# Patient Record
Sex: Male | Born: 1975 | Race: White | Hispanic: No | Marital: Married | Smoking: Never smoker
Health system: Southern US, Community
[De-identification: ages and names within clinical notes are randomized; demographics above are authoritative.]

## PROBLEM LIST (undated history)

## (undated) ENCOUNTER — Emergency Department (HOSPITAL_BASED_OUTPATIENT_CLINIC_OR_DEPARTMENT_OTHER): Payer: BLUE CROSS/BLUE SHIELD

## (undated) DIAGNOSIS — E7800 Pure hypercholesterolemia, unspecified: Secondary | ICD-10-CM

## (undated) HISTORY — PX: WISDOM TOOTH EXTRACTION: SHX21

---

## 2017-10-01 ENCOUNTER — Emergency Department: Payer: BLUE CROSS/BLUE SHIELD

## 2017-10-01 ENCOUNTER — Emergency Department
Admission: EM | Admit: 2017-10-01 | Discharge: 2017-10-01 | Disposition: A | Discharge status: Home / Self Care | Payer: BLUE CROSS/BLUE SHIELD | Attending: Emergency Medicine | Admitting: Emergency Medicine

## 2017-10-01 ENCOUNTER — Other Ambulatory Visit: Payer: Self-pay

## 2017-10-01 ENCOUNTER — Encounter: Payer: Self-pay | Admitting: Emergency Medicine

## 2017-10-01 DIAGNOSIS — R0789 Other chest pain: Secondary | ICD-10-CM

## 2017-10-01 DIAGNOSIS — R079 Chest pain, unspecified: Secondary | ICD-10-CM | POA: Diagnosis present

## 2017-10-01 DIAGNOSIS — R071 Chest pain on breathing: Secondary | ICD-10-CM | POA: Insufficient documentation

## 2017-10-01 HISTORY — DX: Pure hypercholesterolemia, unspecified: E78.00

## 2017-10-01 LAB — BASIC METABOLIC PANEL
Anion gap: 8 (ref 5–15)
BUN: 15 mg/dL (ref 6–20)
CO2: 27 mmol/L (ref 22–32)
CREATININE: 0.87 mg/dL (ref 0.61–1.24)
Calcium: 9.7 mg/dL (ref 8.9–10.3)
Chloride: 103 mmol/L (ref 101–111)
GFR calc Af Amer: >60 mL/min (ref >60)
Glucose, Bld: 92 mg/dL (ref 65–99)
POTASSIUM: 3.9 mmol/L (ref 3.5–5.1)
SODIUM: 138 mmol/L (ref 135–145)

## 2017-10-01 LAB — CBC
HEMATOCRIT: 43.4 % (ref 40.0–52.0)
Hemoglobin: 14.7 g/dL (ref 13.0–18.0)
MCH: 32.1 pg (ref 26.0–34.0)
MCHC: 33.9 g/dL (ref 32.0–36.0)
MCV: 94.7 fL (ref 80.0–100.0)
PLATELETS: 194 10*3/uL (ref 150–440)
RBC: 4.58 MIL/uL (ref 4.40–5.90)
RDW: 12.6 % (ref 11.5–14.5)
WBC: 7.9 10*3/uL (ref 3.8–10.6)

## 2017-10-01 LAB — TROPONIN I

## 2017-10-01 NOTE — Discharge Instructions (Signed)

## 2017-10-01 NOTE — ED Triage Notes (Signed)
Pt c/o substernal chest pain with no radiation. Pt states pain worse with deep inspiration and movement. Pt states pain started Friday night and has worsened since then. Pt is alert and oriented at this time. NAD noted. Able to speak in complete sentences without difficulty.

## 2017-10-01 NOTE — ED Provider Notes (Signed)
[facility] Emergency Department Provider Note  ____________________________________________   First MD Initiated Contact with Patient 10/01/17 1207     (approximate)  I have reviewed the triage vital signs and the nursing notes.   HISTORY  Chief Complaint Chest Pain  HPI Jose Moore is a 42 y.o. male presents for evaluation right-sided chest pain.  Patient reports for at least 2-3 days now is experiencing a achy pain across the right side of the chest.  Worse with inspiration.  No change with exertion.  No shortness of breath.  Does report a recent cough and cold after returning from a [place] vacation.  At present he reports the pain is mild to moderate worse with deep inspiration.  No productive cough.  No runny nose.  No hemoptysis.  Reports it feels very achy and some relief with ibuprofen at home.  Nonradiating.  No left-sided chest pain.  No numbness tingling or weakness.  Does not extend into his abdomen.  Denies abdominal pain  Past Medical History:  Diagnosis Date  . High cholesterol     There are no active problems to display for this patient.   Past Surgical History:  Procedure Laterality Date  . WISDOM TOOTH EXTRACTION      Prior to Admission medications   Not on File    Allergies Patient has no known allergies.  History reviewed. No pertinent family history.  Social History Social History   Tobacco Use  . Smoking status: Never Smoker  . Smokeless tobacco: Never Used  Substance Use Topics  . Alcohol use: Yes    Comment: Occ  . Drug use: No    Review of Systems Constitutional: No fever/chills though he does report about a week or 2 ago he had a cough and "cold" after returning from [place] Eyes: No visual changes. ENT: No sore throat. Cardiovascular: See HPI pain does not get worse with sitting forward or laying back.  Is somewhat improved by ibuprofen Respiratory: Denies shortness of  breath. Gastrointestinal: No abdominal pain.  No nausea, no vomiting.  No diarrhea.  No constipation. Genitourinary: Negative for dysuria. Musculoskeletal: Negative for back pain. Skin: Negative for rash. Neurological: Negative for headaches, focal weakness or numbness.  Denies personal history of heart disease.  Denies early onset of heart disease in his family.  He has not smoked.  He takes no estrogens.  No leg swelling.  No long trips or travel, though he did travel about an hour and a half by plane to [place] earlier in the month.  No leg swelling.  No recent trauma or surgery.  ____________________________________________   PHYSICAL EXAM:  VITAL SIGNS: ED Triage Vitals  Enc Vitals Group     BP 10/01/17 1045 117/79     Pulse Rate 10/01/17 1045 76     Resp 10/01/17 1045 18     Temp 10/01/17 1045 98.1 F (36.7 C)     Temp Source 10/01/17 1045 Oral     SpO2 10/01/17 1045 98 %     Weight 10/01/17 1042 180 lb (81.6 kg)     Height 10/01/17 1042 5\' 10"  (1.778 m)     Head Circumference --      Peak Flow --      Pain Score 10/01/17 1042 2     Pain Loc --      Pain Edu? --      Excl. in GC? --     Constitutional: Alert and oriented. Well appearing and in  no acute distress. Eyes: Conjunctivae are normal. Head: Atraumatic. Nose: No congestion/rhinnorhea. Mouth/Throat: Mucous membranes are moist. Neck: No stridor.   Cardiovascular: Normal rate, regular rhythm. Grossly normal heart sounds.  Good peripheral circulation.  He does report tenderness to palpation along the right sternal border.  Reports pain is reproduced when palpating just along the inferior margin of the right sternal border. Respiratory: Normal respiratory effort.  No retractions. Lungs CTAB. Gastrointestinal: Soft and nontender. No distention.  Negative Murphy. Musculoskeletal: No lower extremity tenderness nor edema. Neurologic:  Normal speech and language. No gross focal neurologic deficits are  appreciated.  Skin:  Skin is warm, dry and intact. No rash noted. Psychiatric: Mood and affect are normal. Speech and behavior are normal.  ____________________________________________   LABS (all labs ordered are listed, but only abnormal results are displayed)  Labs Reviewed  BASIC METABOLIC PANEL  CBC  TROPONIN I  TROPONIN I   ____________________________________________  EKG  Reviewed enterotomy at 10:45 AM Heart rate 75 QRS 90 QTC 420 Normal sinus rhythm, no evidence of ischemia or ectopy except for a single nonspecific T wave inversion in V3 which is extremely nonspecific. No STE. ____________________________________________  RADIOLOGY  Chest x-ray reviewed by me, normal.    ____________________________________________   PROCEDURES  Procedure(s) performed: None  Procedures  Critical Care performed: No  ____________________________________________   INITIAL IMPRESSION / ASSESSMENT AND PLAN / ED COURSE  Pertinent labs & imaging results that were available during my care of the patient were reviewed by me and considered in my medical decision making (see chart for details).  Differential diagnosis includes, but is not limited to, ACS, aortic dissection, pulmonary embolism, cardiac tamponade, pneumothorax, pneumonia, pericarditis, myocarditis, GI-related causes including esophagitis/gastritis, and musculoskeletal chest wall pain.         Pulmonary Embolism Rule-out Criteria (PERC rule)                        If YES to ANY of the following, the PERC rule is not satisfied and cannot be used to rule out PE in this patient (consider d-dimer or imaging depending on pre-test probability).                      If NO to ALL of the following, AND the clinician's pre-test probability is <15%, the [facility] rule is satisfied and there is no need for further workup (including no need to obtain a d-dimer) as the post-test probability of pulmonary embolism is <2%.                       Mnemonic is HAD CLOTS   H - hormone use (exogenous estrogen)      No. A - age > 50                                                 No. D - DVT/PE history                                      No.   C - coughing blood (hemoptysis)                 No. L - leg swelling, unilateral  No. O - O2 Sat on Room Air < 95%                  No. T - tachycardia (HR ? 100)                         No. S - surgery or trauma, recent                      No.   Based on my evaluation of the patient, including application of this decision instrument, further testing to evaluate for pulmonary embolism is not indicated at this time. I have discussed this recommendation with the patient who states understanding and agreement with this plan.  HEART score < 3, low risk for ACS.  Reproducible, atypical in nature.  Stable hemodynamics.  Reports a pleuritic type pain over the right chest wall, recent upper respiratory illness, also has reproducibility to tenderness along the right costal margin.  Suspect likely costochondritis, musculoskeletal etiology and unlikely to represent acute coronary syndrome, pulmonary embolism, acute pulmonary etiology etc.  Very reassuring.  No ripping tearing or moving pain.  No abdominal pain.  No pain in the right upper quadrant.  ----------------------------------------- 2:43 PM on 10/01/2017 -----------------------------------------  Patient ambulatory in hall, reports feeling well, no distress, second troponin is normal.  Discussed with patient and his wife, he will follow-up with cardiology, chest pain return precautions reviewed.  Patient agreeable.  He was stable for ongoing outpatient workup      ____________________________________________   FINAL CLINICAL IMPRESSION(S) / ED DIAGNOSES  Final diagnoses:  Costochondral chest pain  Atypical chest pain      NEW MEDICATIONS STARTED DURING THIS VISIT:  There are no discharge  medications for this patient.    Note:  This document was prepared using Dragon voice recognition software and may include unintentional dictation errors.     Sharyn CreamerQuale, Kameria Canizares, MD 10/01/17 2051

## 2018-12-14 IMAGING — CR DG CHEST 2V
1 series · 3 of 3 positions shown · non-contrast
Comparison: None.

CLINICAL DATA: Substernal chest pain.

EXAM:
CHEST  2 VIEW

[Series 1: dg chest 2 view · 0.14mm/px · 3 of 3 slices shown]
[im 1/3]
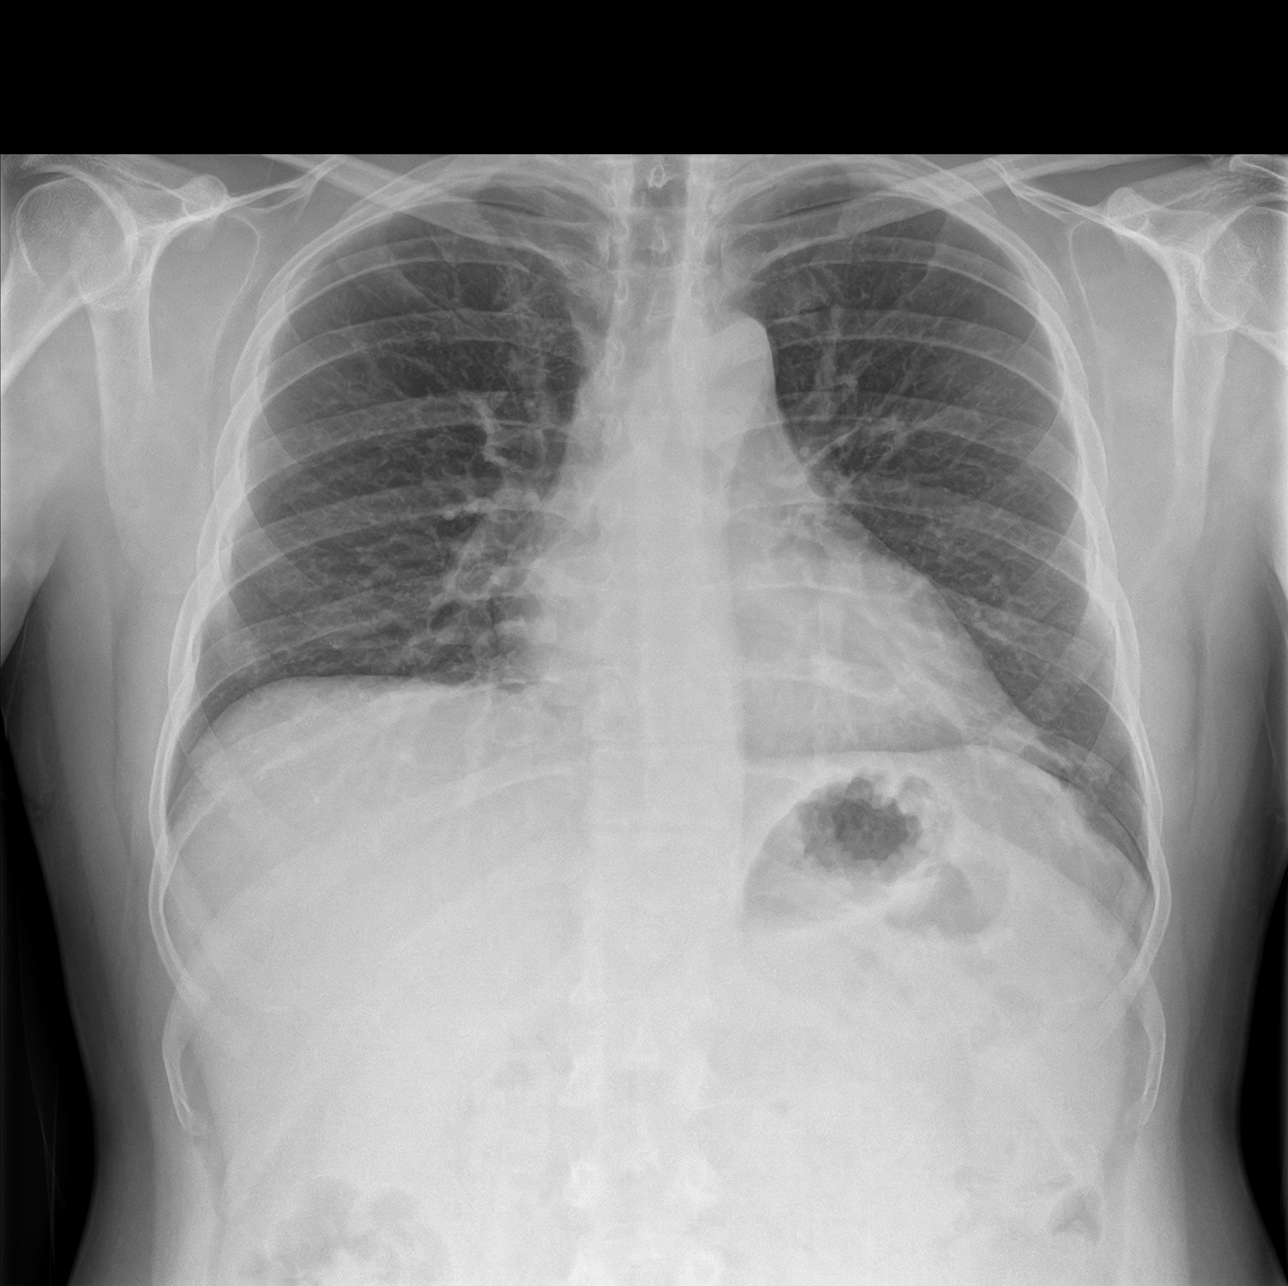
[im 2/3]
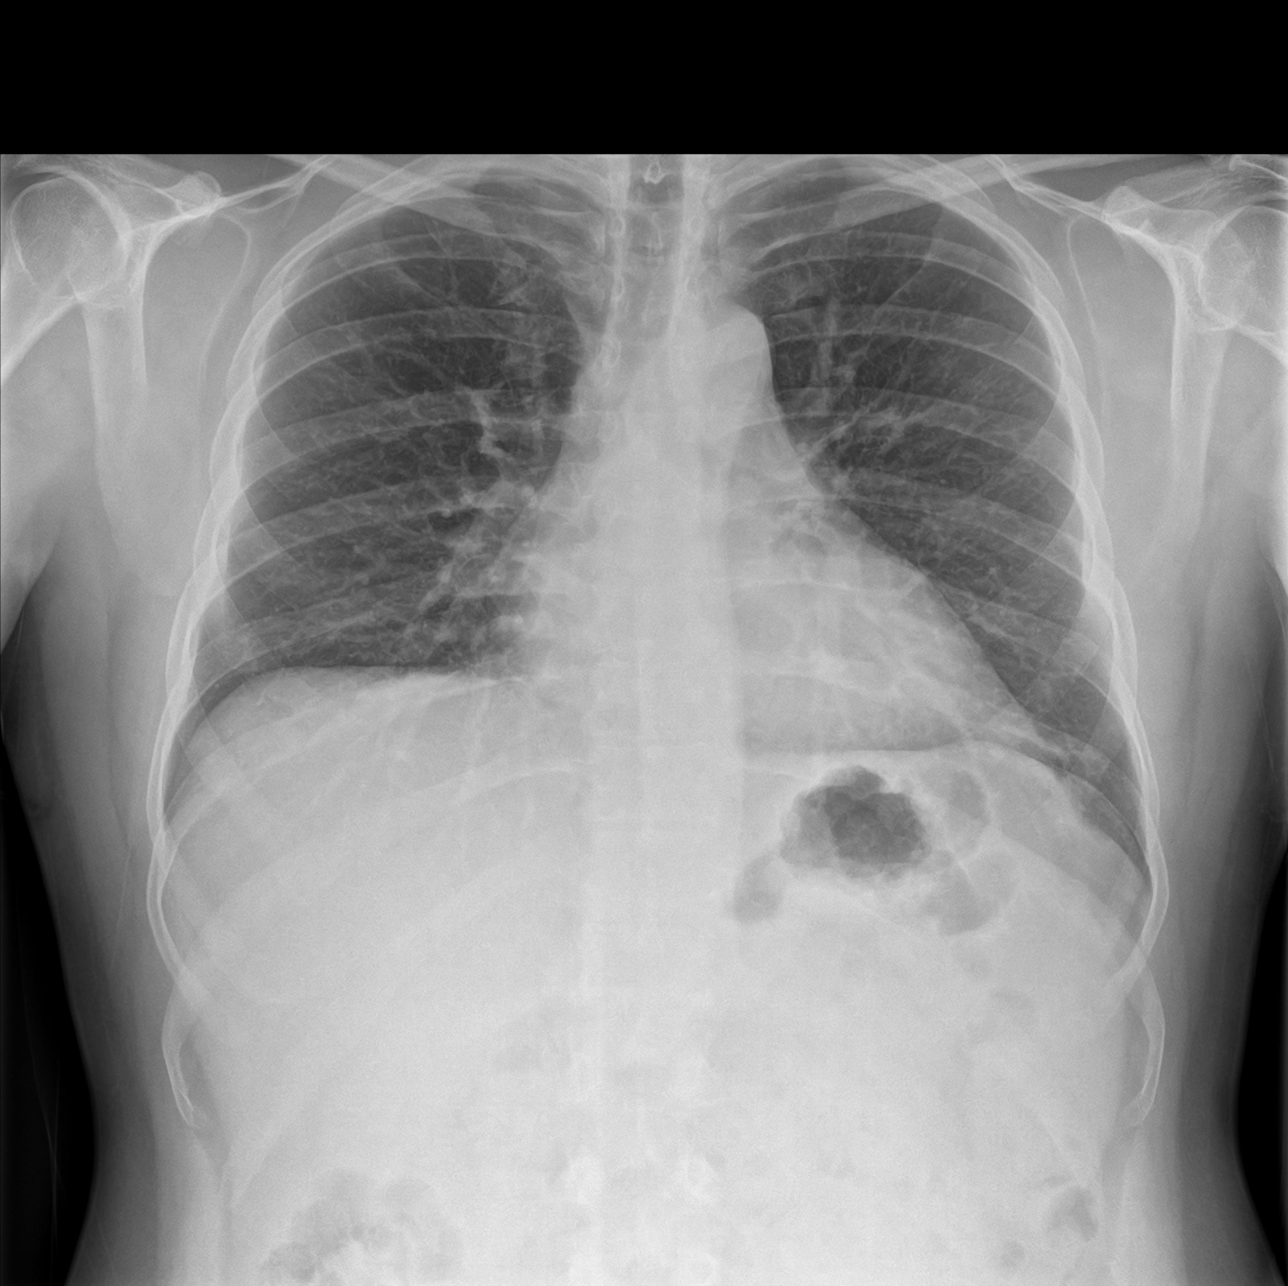
[im 3/3]
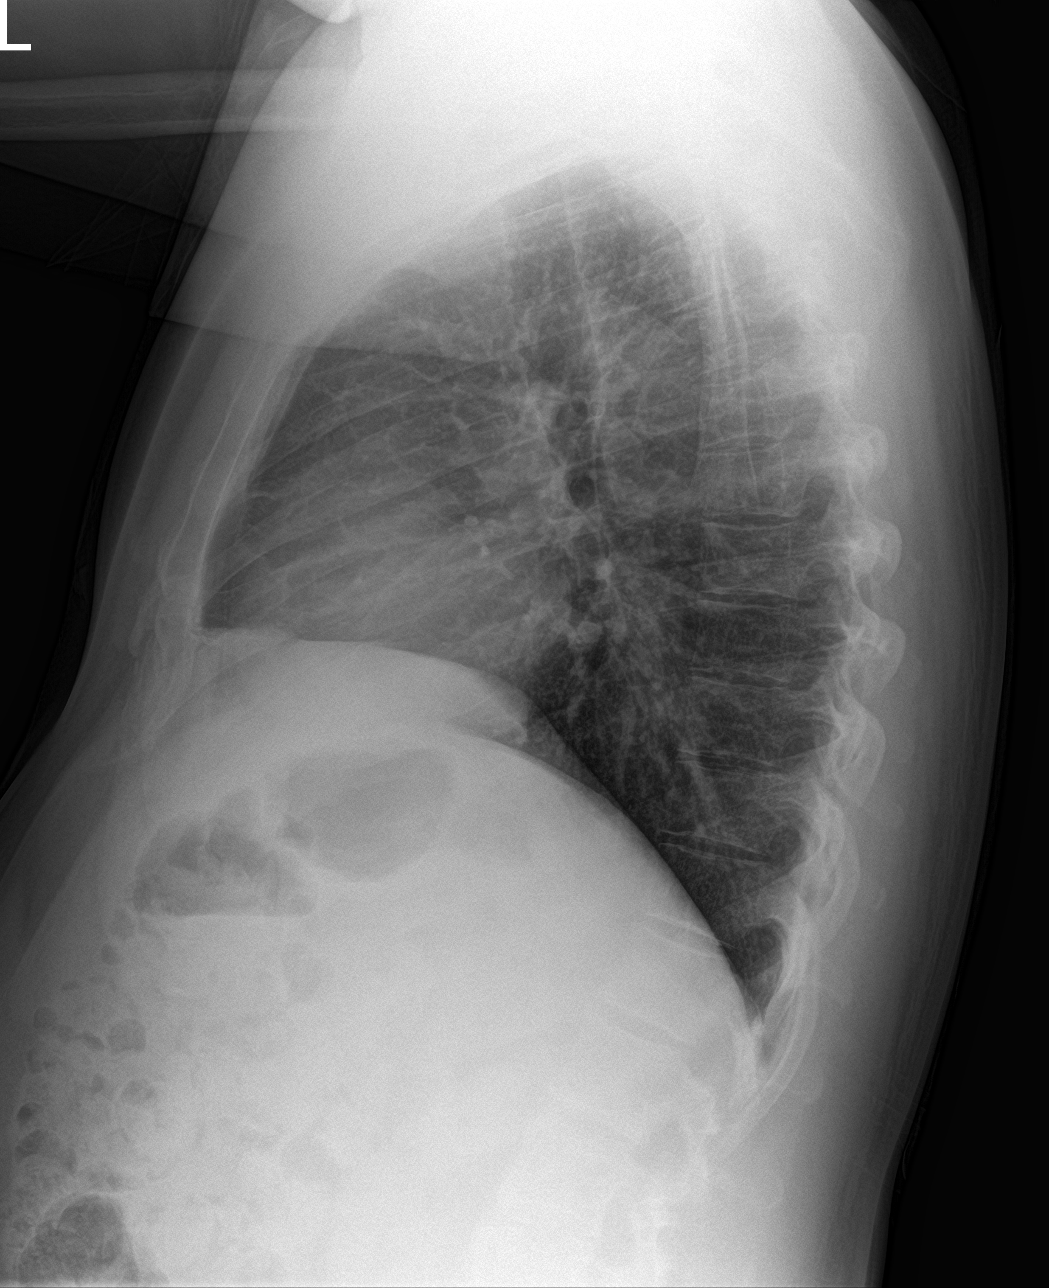

[3 of 3 positions shown; findings below may reference images not displayed]

FINDINGS: Cardiomediastinal silhouette is normal. Mediastinal contours appear
intact.

There is no evidence of focal airspace consolidation, pleural
effusion or pneumothorax.

Osseous structures are without acute abnormality. Soft tissues are
grossly normal.
IMPRESSION: No active cardiopulmonary disease.
